# Patient Record
Sex: Male | Born: 1990 | Race: White | Hispanic: No | Marital: Single | Smoking: Current every day smoker
Health system: Southern US, Community
[De-identification: ages and names within clinical notes are randomized; demographics above are authoritative.]

---

## 2016-04-22 ENCOUNTER — Emergency Department
Admission: EM | Admit: 2016-04-22 | Discharge: 2016-04-23 | Disposition: A | Discharge status: Home / Self Care | Payer: Medicaid Other | Attending: Emergency Medicine | Admitting: Emergency Medicine

## 2016-04-22 ENCOUNTER — Encounter: Payer: Self-pay | Admitting: Emergency Medicine

## 2016-04-22 DIAGNOSIS — K029 Dental caries, unspecified: Secondary | ICD-10-CM

## 2016-04-22 DIAGNOSIS — K047 Periapical abscess without sinus: Secondary | ICD-10-CM | POA: Insufficient documentation

## 2016-04-22 DIAGNOSIS — K0889 Other specified disorders of teeth and supporting structures: Secondary | ICD-10-CM | POA: Diagnosis present

## 2016-04-22 MED ORDER — CLINDAMYCIN HCL 300 MG PO CAPS: 300.0000 mg | ORAL_CAPSULE | Freq: Three times a day (TID) | ORAL | 0 refills | Status: AC

## 2016-04-22 MED ORDER — TRAMADOL HCL 50 MG PO TABS: 50.0000 mg | ORAL_TABLET | Freq: Two times a day (BID) | ORAL | 0 refills | Status: DC

## 2016-04-22 MED ORDER — TRAMADOL HCL 50 MG PO TABS
50.0000 mg | ORAL_TABLET | Freq: Once | ORAL | Status: AC
  Administered 2016-04-23: 50 mg via ORAL
  Filled 2016-04-22: qty 1

## 2016-04-22 MED ORDER — CLINDAMYCIN HCL 150 MG PO CAPS
300.0000 mg | ORAL_CAPSULE | Freq: Once | ORAL | Status: AC
  Administered 2016-04-23: 300 mg via ORAL
  Filled 2016-04-22: qty 2

## 2016-04-22 NOTE — Discharge Instructions (Signed)
Take the antibiotic as directed. Take the pain medicine as needed. Rinse with warm-salty water after every meal. Follow-up with one of the dental clinics on the list provided.

## 2016-04-22 NOTE — ED Notes (Addendum)
States back bottom R tooth pain x 3 weeks but states it increased today. Seen a dentist "a while ago." Alert and oriented, no distress noted. States was told "it was an abscess tooth" but didn't have insurance to get it fixed.

## 2016-04-22 NOTE — ED Triage Notes (Signed)
Patient ambulatory to triage with steady gait, without difficulty or distress noted; pt reports right sided dental pain x 2-3 weeks, worse today

## 2016-04-23 NOTE — ED Provider Notes (Signed)
[facility] Emergency Department Provider Note ____________________________________________  Time seen: 2356  I have reviewed the triage vital signs and the nursing notes.  HISTORY  Chief Complaint  Dental Pain  HPI Benjamin [name] is a 25 y.o. male presents to the ED for evaluation of dental pain for the last 2-3 weeks. Patient describes pain to the right lower jaw secondary to swelling of the molar. He does admit to a chronically broken tooth with an intact filling. Denies any interim fevers, chills, sweats. He also denies any spontaneous drainage or difficulty controlling oral secretions. He denies any dental provider for his symptoms.  History reviewed. No pertinent past medical history.  There are no active problems to display for this patient.   History reviewed. No pertinent surgical history.  Prior to Admission medications   Medication Sig Start Date End Date Taking? Authorizing Provider  clindamycin (CLEOCIN) 300 MG capsule Take 1 capsule (300 mg total) by mouth 3 (three) times daily. 04/22/16 05/02/16  Ean Gettel V Bacon Rasheida Broden, PA-C  traMADol (ULTRAM) 50 MG tablet Take 1 tablet (50 mg total) by mouth 2 (two) times daily. 04/22/16   Dory Demont V Bacon Ramsey Guadamuz, PA-C   Allergies Review of patient's allergies indicates no known allergies.  No family history on file.  Social History Social History  Substance Use Topics  . Smoking status: Never Smoker  . Smokeless tobacco: Never Used  . Alcohol use No   Review of Systems  Constitutional: Negative for fever. Eyes: Negative for visual changes. ENT: Negative for sore throat. Right lower dental pain as above. Cardiovascular: Negative for chest pain. Respiratory: Negative for shortness of breath. Neurological: Negative for headaches, focal weakness or numbness. ____________________________________________  PHYSICAL EXAM:  VITAL SIGNS: ED Triage Vitals  Enc Vitals Group     BP 04/22/16 2252 (!)  121/95     Pulse Rate 04/22/16 2252 89     Resp 04/22/16 2252 (!) 22     Temp 04/22/16 2252 97.9 F (36.6 C)     Temp Source 04/22/16 2252 Oral     SpO2 04/22/16 2252 98 %     Weight 04/22/16 2251 181 lb (82.1 kg)     Height 04/22/16 2251 6\' 1"  (1.854 m)     Head Circumference --      Peak Flow --      Pain Score 04/22/16 2251 9     Pain Loc --      Pain Edu? --      Excl. in GC? --    Constitutional: Alert and oriented. Well appearing and in no distress. Head: Normocephalic and atraumatic. Eyes: Conjunctivae are normal. PERRL. Normal extraocular movements Mouth/Throat: Mucous membranes are moist. Uvula is midline and tonsils are flat. No swelling to the floor the mouth is patient. Patient small focal area of buccal edema to the right lower first molar. Neck: Supple. No thyromegaly. Hematological/Lymphatic/Immunological: No cervical lymphadenopathy. Cardiovascular: Normal rate, regular rhythm. Normal distal pulses. Respiratory: Normal respiratory effort. No wheezes/rales/rhonchi. Skin:  Skin is warm, dry and intact. No rash noted. Psychiatric: Mood and affect are normal. Patient exhibits appropriate insight and judgment. ____________________________________________  PROCEDURES  Ultram 50 mg PO Clindamycin 300 mg PO ____________________________________________  INITIAL IMPRESSION / ASSESSMENT AND PLAN / ED COURSE  Patient with acute dental pain secondary to dental caries. She is also with a small dental abscess to lower right jaw. He is discharged with prescription for clindamycin and #10 Ultram for pain. He will follow-up with one of the  local dental clinics on the list provided.  Clinical Course   ____________________________________________  FINAL CLINICAL IMPRESSION(S) / ED DIAGNOSES  Final diagnoses:  Pain due to dental caries  Dental abscess      Lissa HoardJenise V Bacon Millissa Deese, PA-C 04/23/16 0018    Phineas SemenGraydon Goodman, MD 04/23/16 [phone]

## 2020-06-03 ENCOUNTER — Encounter: Payer: Self-pay | Admitting: Emergency Medicine

## 2020-06-03 ENCOUNTER — Other Ambulatory Visit: Payer: Self-pay

## 2020-06-03 ENCOUNTER — Ambulatory Visit
Admission: EM | Admit: 2020-06-03 | Discharge: 2020-06-03 | Disposition: A | Discharge status: Home / Self Care | Payer: Medicaid Other | Attending: Emergency Medicine | Admitting: Emergency Medicine

## 2020-06-03 DIAGNOSIS — M545 Low back pain, unspecified: Secondary | ICD-10-CM | POA: Diagnosis not present

## 2020-06-03 MED ORDER — TIZANIDINE HCL 4 MG PO TABS: 4.0000 mg | ORAL_TABLET | Freq: Four times a day (QID) | ORAL | 0 refills | Status: DC | PRN

## 2020-06-03 MED ORDER — IBUPROFEN 600 MG PO TABS: 600.0000 mg | ORAL_TABLET | Freq: Four times a day (QID) | ORAL | 0 refills | Status: DC | PRN

## 2020-06-03 NOTE — Discharge Instructions (Addendum)
Take the ibuprofen every 6 hours with food for the next 2 days on a schedule and then as needed for pain.  Take the tizanidine every 6 hours on a schedule for the next 2 days and then as needed spasm.  I have referred you to [facility] for evaluation of the lipoma on your low back.  Please discuss treatment options, including removal, with them.

## 2020-06-03 NOTE — ED Provider Notes (Signed)
[facility]    CSN: [id number] Arrival date & time: 06/03/20  1213      History   Chief Complaint Chief Complaint  Patient presents with   Cyst   Back Pain    HPI Benjamin [name] is a 29 y.o. male.   HPI   29 year old male here for evaluation of low back pain that started yesterday.  Patient reports that he has had a knot in his low back that is been there for years but is now causing pain.  Patient states that he coughed yesterday and the felt the pain has been sharp ever since.  Patient denies any radiation into either leg.  No numbness tingling or weakness in either leg.  History reviewed. No pertinent past medical history.  There are no problems to display for this patient.   History reviewed. No pertinent surgical history.     Home Medications    Prior to Admission medications   Medication Sig Start Date End Date Taking? Authorizing Provider  ibuprofen (ADVIL) 600 MG tablet Take 1 tablet (600 mg total) by mouth every 6 (six) hours as needed. 06/03/20   Becky Augusta, NP  tiZANidine (ZANAFLEX) 4 MG tablet Take 1 tablet (4 mg total) by mouth every 6 (six) hours as needed for muscle spasms. 06/03/20   Becky Augusta, NP    Family History History reviewed. No pertinent family history.  Social History Social History   Tobacco Use   Smoking status: Current Every Day Smoker    Types: Cigarettes   Smokeless tobacco: Never Used  Substance Use Topics   Alcohol use: Yes   Drug use: Never     Allergies   Penicillins   Review of Systems Review of Systems  Constitutional: Negative for activity change and appetite change.  Musculoskeletal: Positive for back pain.  Skin: Negative for rash.  Neurological: Negative for weakness and numbness.  Hematological: Negative.   Psychiatric/Behavioral: Negative.      Physical Exam Triage Vital Signs ED Triage Vitals  Enc Vitals Group     BP 06/03/20 1254 (!) 143/100     Pulse Rate 06/03/20 1254  85     Resp 06/03/20 1254 18     Temp 06/03/20 1254 98.2 F (36.8 C)     Temp Source 06/03/20 1254 Oral     SpO2 06/03/20 1254 100 %     Weight 06/03/20 1252 180 lb (81.6 kg)     Height 06/03/20 1252 6\' 1"  (1.854 m)     Head Circumference --      Peak Flow --      Pain Score 06/03/20 1252 10     Pain Loc --      Pain Edu? --      Excl. in GC? --    No data found.  Updated Vital Signs BP (!) 143/100 (BP Location: Left Arm)    Pulse 85    Temp 98.2 F (36.8 C) (Oral)    Resp 18    Ht 6\' 1"  (1.854 m)    Wt 180 lb (81.6 kg)    SpO2 100%    BMI 23.75 kg/m   Visual Acuity Right Eye Distance:   Left Eye Distance:   Bilateral Distance:    Right Eye Near:   Left Eye Near:    Bilateral Near:     Physical Exam Vitals and nursing note reviewed.  Constitutional:      General: He is not in acute distress.    Appearance:  Normal appearance. He is normal weight.  HENT:     Head: Normocephalic and atraumatic.  Musculoskeletal:        General: Swelling and tenderness present. No deformity or signs of injury.  Skin:    General: Skin is warm and dry.     Findings: No erythema or rash.  Neurological:     General: No focal deficit present.     Mental Status: He is alert.     Sensory: No sensory deficit.     Motor: No weakness.     Coordination: Coordination normal.     Gait: Gait normal.  Psychiatric:        Mood and Affect: Mood normal.        Behavior: Behavior normal.        Thought Content: Thought content normal.        Judgment: Judgment normal.      UC Treatments / Results  Labs (all labs ordered are listed, but only abnormal results are displayed) Labs Reviewed - No data to display  EKG   Radiology No results found.  Procedures Procedures (including critical care time)  Medications Ordered in UC Medications - No data to display  Initial Impression / Assessment and Plan / UC Course  I have reviewed the triage vital signs and the nursing notes.  Pertinent  labs & imaging results that were available during my care of the patient were reviewed by me and considered in my medical decision making (see chart for details).   Patient is here for evaluation of low back pain that started yesterday.  Patient has a lipoma overlying his sacral spine.  The lipoma is just right of midline.  Is not erythematous, edematous, indurated, or fluctuant.  The lipoma is freely mobile.  Patient has no appreciable paraspinous tenderness or spasm.  Patient has no weakness in his lower extremities.  Unsure if lipoma is the cause of pain.  Patient denies any trauma.  Patient is ambulatory without difficulty.   Final Clinical Impressions(s) / UC Diagnoses   Final diagnoses:  Acute low back pain without sciatica, unspecified back pain laterality     Discharge Instructions     Take the ibuprofen every 6 hours with food for the next 2 days on a schedule and then as needed for pain.  Take the tizanidine every 6 hours on a schedule for the next 2 days and then as needed spasm.  I have referred you to [facility] for evaluation of the lipoma on your low back.  Please discuss treatment options, including removal, with them.    ED Prescriptions    Medication Sig Dispense Auth. Provider   ibuprofen (ADVIL) 600 MG tablet Take 1 tablet (600 mg total) by mouth every 6 (six) hours as needed. 30 tablet Becky Augusta, NP   tiZANidine (ZANAFLEX) 4 MG tablet Take 1 tablet (4 mg total) by mouth every 6 (six) hours as needed for muscle spasms. 30 tablet Becky Augusta, NP     I have reviewed the PDMP during this encounter.   Becky Augusta, NP 06/03/20 1351

## 2020-06-03 NOTE — ED Triage Notes (Signed)
Patient c/o lower back that started yesterday. He reports a cyst in that area that he has had for years but now is causing pain.

## 2020-12-03 ENCOUNTER — Ambulatory Visit: Payer: Medicaid Other | Admitting: Dermatology

## 2021-02-09 ENCOUNTER — Encounter: Payer: Self-pay | Admitting: Emergency Medicine

## 2021-02-09 ENCOUNTER — Other Ambulatory Visit: Payer: Self-pay

## 2021-02-09 ENCOUNTER — Ambulatory Visit (INDEPENDENT_AMBULATORY_CARE_PROVIDER_SITE_OTHER): Payer: Medicaid Other

## 2021-02-09 ENCOUNTER — Ambulatory Visit
Admission: EM | Admit: 2021-02-09 | Discharge: 2021-02-09 | Disposition: A | Discharge status: Home / Self Care | Payer: Medicaid Other | Attending: Emergency Medicine | Admitting: Emergency Medicine

## 2021-02-09 DIAGNOSIS — K208 Other esophagitis without bleeding: Secondary | ICD-10-CM | POA: Diagnosis not present

## 2021-02-09 DIAGNOSIS — T50905A Adverse effect of unspecified drugs, medicaments and biological substances, initial encounter: Secondary | ICD-10-CM

## 2021-02-09 DIAGNOSIS — R079 Chest pain, unspecified: Secondary | ICD-10-CM

## 2021-02-09 LAB — TROPONIN I (HIGH SENSITIVITY): Troponin I (High Sensitivity): 4 ng/L (ref <18)

## 2021-02-09 MED ORDER — FAMOTIDINE 20 MG PO TABS: 20.0000 mg | ORAL_TABLET | Freq: Two times a day (BID) | ORAL | 0 refills | Status: AC

## 2021-02-09 MED ORDER — LIDOCAINE VISCOUS HCL 2 % MT SOLN
15.0000 mL | Freq: Once | OROMUCOSAL | Status: AC
  Administered 2021-02-09: 15 mL via ORAL

## 2021-02-09 MED ORDER — ALUM & MAG HYDROXIDE-SIMETH 200-200-20 MG/5ML PO SUSP
30.0000 mL | Freq: Once | ORAL | Status: AC
  Administered 2021-02-09: 30 mL via ORAL

## 2021-02-09 MED ORDER — IBUPROFEN 600 MG PO TABS: 600.0000 mg | ORAL_TABLET | Freq: Four times a day (QID) | ORAL | 0 refills | Status: AC | PRN

## 2021-02-09 NOTE — Discharge Instructions (Addendum)
I suspect that you have an esophagitis from taking all of the niacin plus minus musculoskeletal chest wall pain.  You can try Pepcid which may help with the esophagitis.  1000 milligrams Tylenol/600 mg ibuprofen combined 3-4 times a day should help with musculoskeletal chest wall pain.  Follow-up with primary care provider of your choice ASAP.  See list below.  Here is a list of primary care providers who are taking new patients:  Dr. Elizabeth Sauer [address] [place] [address] [phone]  [facility] [address] [place], [place] [address] [phone]  [facility] [place] [address] [place] [address]  [phone]  [facility] [address] [place], [place] [address] [phone]  [facility] [address] [place]  [phone] [place], [place] [address]  Here are clinics/ other resources who will see you if you do not have insurance. Some have certain criteria that you must meet. Call them and find out what they are:  [facility]: [address], [place], [place] [address] Phone: [phone] Hours: First and Third Saturdays of each Month, 9 a.m. - 1 p.m.  [facility]: [address], [address] [place], [place] [address] Phone: [phone] Hours: Tuesday, 4 p.m. - 8 p.m. Thursday, 1 p.m. - 8 p.m. Wednesday, 9 a.m. - [facility] [address] [place], [place] [address] Phone: [phone] Pharmacy Phone Number: [phone] Dental Phone Number: [phone] [facility] Insurance Help: [phone]  Dental Hours: Monday - Thursday, 8 a.m. - 6 p.m.  [facility] [address] [place], [place] [address] Phone: [phone] Pharmacy Phone Number: [phone] [facility] Insurance Help: [phone]  [facility] [address] [place] [place], [place] [address] Phone: [phone] Pharmacy Phone Number: [phone] [facility] Insurance Help:  [phone]  [facility] [address] [place], [place] [address] Phone: [phone] [facility] Insurance Help: [phone]   [facility] [address], [place], [place] [address] Phone: [phone]  Go to www.goodrx.com  or www.costplusdrugs.com to look up your medications. This will give you a list of where you can find your prescriptions at the most affordable prices. Or ask the pharmacist what the cash price is, or if they have any other discount programs available to help make your medication more affordable. This can be less expensive than what you would pay with insurance.

## 2021-02-09 NOTE — ED Triage Notes (Signed)
Patient complains of left sided chest pain that varies x 3-4 days. States that he feels like he has palpitations with it as well.

## 2021-02-09 NOTE — ED Provider Notes (Signed)
HPI  SUBJECTIVE:  Benjamin [name] is a 30 y.o. male who presents with 4 days of constant, sharp, left-sided chest pain and intermittent palpitations starting the day after taking 14 niacin in an effort to get [facility] out of his system.  He states it radiates to his neck with coughing only.  It does not otherwise radiate up his neck, down his arm, through to his back.  No nausea, vomiting, diaphoresis, chest pressure, heaviness.  There is no exertional component.  No belching, waterbrash, coughing, wheezing, shortness of breath, recent trauma to the chest.  He does lots of heavy lifting at work, but has not otherwise changes to physical activity recently.  It is not associated with arm movement, torso rotation.  No lightheadedness, dizziness, abdominal pain.  No calf pain, swelling, recent immobilization, surgery in the past 4 weeks, hemoptysis, exogenous estrogen.  He has not tried anything for this.  No alleviating factors.  Symptoms are worse with coughing, movement, lying down, smoking and with getting upset.  No recent viral illness.  he smokes marijuana and tobacco.  He denies other illicit drug use.  No history of diabetes, hypertension, obesity, MI, CVA/TIA, PAD/PVD, hypercholesterolemia, coronary artery disease, DVT, PE, cancer, hypercoagulability.  Family history significant for father with an MI at age 5, grandfather in his 28s.  PMD: None.   History reviewed. No pertinent past medical history.  History reviewed. No pertinent surgical history.  History reviewed. No pertinent family history.  Social History   Tobacco Use   Smoking status: Every Day    Types: Cigarettes   Smokeless tobacco: Never  Substance Use Topics   Alcohol use: Yes   Drug use: Never    No current facility-administered medications for this encounter.  Current Outpatient Medications:    famotidine (PEPCID) 20 MG tablet, Take 1 tablet (20 mg total) by mouth 2 (two) times daily., Disp: 40 tablet, Rfl: 0   ibuprofen  (ADVIL) 600 MG tablet, Take 1 tablet (600 mg total) by mouth every 6 (six) hours as needed., Disp: 30 tablet, Rfl: 0  Allergies  Allergen Reactions   Penicillins      ROS  As noted in HPI.   Physical Exam  BP (!) 142/79 (BP Location: Right Arm)   Pulse 82   Temp 99.1 F (37.3 C) (Oral)   Resp 15   Ht 6\' 1"  (1.854 m)   Wt 86.2 kg   SpO2 100%   BMI 25.07 kg/m   Constitutional: Well developed, well nourished, appears anxious Eyes:  EOMI, conjunctiva normal bilaterally HENT: Normocephalic, atraumatic,mucus membranes moist Respiratory: Normal inspiratory effort, lungs clear bilaterally.  Positive left-sided anterior chest wall tenderness. Cardiovascular: Normal rate, regular rhythm, no murmurs, rubs, gallops GI: nondistended skin: No rash, skin intact Musculoskeletal: Calves symmetric, nontender, no edema Neurologic: Alert & oriented x 3, no focal neuro deficits Psychiatric: Speech and behavior appropriate   ED Course   Medications  alum & mag hydroxide-simeth (MAALOX/MYLANTA) 200-200-20 MG/5ML suspension 30 mL (30 mLs Oral Given 02/09/21 1602)    And  lidocaine (XYLOCAINE) 2 % viscous mouth solution 15 mL (15 mLs Oral Given 02/09/21 1602)    Orders Placed This Encounter  Procedures   DG Chest 2 View    Standing Status:   Standing    Number of Occurrences:   1    Order Specific Question:   Reason for Exam (SYMPTOM  OR DIAGNOSIS REQUIRED)    Answer:   left sided CP   EKG 12-Lead  Standing Status:   Standing    Number of Occurrences:   1    Results for orders placed or performed during the hospital encounter of 02/09/21 (from the past 24 hour(s))  Troponin I (High Sensitivity)     Status: None   Collection Time: 02/09/21  4:08 PM  Result Value Ref Range   Troponin I (High Sensitivity) 4 <18 ng/L   DG Chest 2 View  Result Date: 02/09/2021 CLINICAL DATA:  Left-sided chest pain EXAM: CHEST - 2 VIEW COMPARISON:  None. FINDINGS: The heart size and mediastinal  contours are within normal limits. Both lungs are clear. The visualized skeletal structures are unremarkable. IMPRESSION: No active cardiopulmonary disease. Electronically Signed   By: Signa Kell M.D.   On: 02/09/2021 16:45    ED Clinical Impression  1. Nonspecific chest pain   2. Pill esophagitis      ED Assessment/Plan  EKG: Normal sinus rhythm, rate 78.  Normal axis, normal intervals.  No hypertrophy.  No ST-T wave changes.  No previous EKG for comparison.  Patient symptomatic while EKG was obtained. Patient is PERC negative.  Doubt PE.  Suspect pill esophagitis or musculoskeletal chest pain because it is reproducible.  He has a normal EKG.  will check a troponin due to risk factors of smoking and significant family history. Chest x-ray to rule out esophageal perforation.  GI cocktail.  Will reevaluate.   Reviewed imaging independently.  Normal chest x-ray. see radiology report for full details.  Reevaluation, patient states that he feels better after GI cocktail.  Troponin negative.  EKG, chest x-ray reassuring.  No evidence of pericarditis, myocarditis.  No evidence of ACS. His HEART score is 1-patient at very low risk of MACE over the next 30 days. suspect esophagitis plus minus musculoskeletal chest pain.  Will send home with Tylenol/ibuprofen and some Pepcid.  Primary care list for ongoing care, will order assistance in finding a PMD.  Strict ER return precautions given.  Discussed labs, imaging, MDM, treatment plan, and plan for follow-up with patient. Discussed sn/sx that should prompt return to the ED. patient agrees with plan.   Meds ordered this encounter  Medications   AND Linked Order Group    alum & mag hydroxide-simeth (MAALOX/MYLANTA) 200-200-20 MG/5ML suspension 30 mL    lidocaine (XYLOCAINE) 2 % viscous mouth solution 15 mL   famotidine (PEPCID) 20 MG tablet    Sig: Take 1 tablet (20 mg total) by mouth 2 (two) times daily.    Dispense:  40 tablet    Refill:  0    ibuprofen (ADVIL) 600 MG tablet    Sig: Take 1 tablet (600 mg total) by mouth every 6 (six) hours as needed.    Dispense:  30 tablet    Refill:  0      *This clinic note was created using Scientist, clinical (histocompatibility and immunogenetics). Therefore, there may be occasional mistakes despite careful proofreading.  ?    Domenick Gong, MD 02/09/21 1736

## 2021-03-23 ENCOUNTER — Emergency Department
Admission: EM | Admit: 2021-03-23 | Discharge: 2021-03-24 | Disposition: A | Discharge status: Other Acute Inpatient Hospital | Payer: Medicaid Other | Attending: Emergency Medicine | Admitting: Emergency Medicine

## 2021-03-23 ENCOUNTER — Other Ambulatory Visit: Payer: Self-pay

## 2021-03-23 ENCOUNTER — Encounter: Payer: Self-pay | Admitting: Emergency Medicine

## 2021-03-23 DIAGNOSIS — T450X2A Poisoning by antiallergic and antiemetic drugs, intentional self-harm, initial encounter: Secondary | ICD-10-CM | POA: Diagnosis present

## 2021-03-23 DIAGNOSIS — F1290 Cannabis use, unspecified, uncomplicated: Secondary | ICD-10-CM | POA: Diagnosis not present

## 2021-03-23 DIAGNOSIS — R45851 Suicidal ideations: Secondary | ICD-10-CM

## 2021-03-23 DIAGNOSIS — T50902A Poisoning by unspecified drugs, medicaments and biological substances, intentional self-harm, initial encounter: Secondary | ICD-10-CM | POA: Diagnosis not present

## 2021-03-23 DIAGNOSIS — F1590 Other stimulant use, unspecified, uncomplicated: Secondary | ICD-10-CM | POA: Insufficient documentation

## 2021-03-23 DIAGNOSIS — F1490 Cocaine use, unspecified, uncomplicated: Secondary | ICD-10-CM | POA: Insufficient documentation

## 2021-03-23 DIAGNOSIS — F1410 Cocaine abuse, uncomplicated: Secondary | ICD-10-CM

## 2021-03-23 DIAGNOSIS — F322 Major depressive disorder, single episode, severe without psychotic features: Secondary | ICD-10-CM | POA: Diagnosis not present

## 2021-03-23 DIAGNOSIS — F1010 Alcohol abuse, uncomplicated: Secondary | ICD-10-CM

## 2021-03-23 DIAGNOSIS — Y906 Blood alcohol level of 120-199 mg/100 ml: Secondary | ICD-10-CM | POA: Insufficient documentation

## 2021-03-23 DIAGNOSIS — F17210 Nicotine dependence, cigarettes, uncomplicated: Secondary | ICD-10-CM | POA: Insufficient documentation

## 2021-03-23 DIAGNOSIS — F332 Major depressive disorder, recurrent severe without psychotic features: Secondary | ICD-10-CM | POA: Diagnosis not present

## 2021-03-23 LAB — COMPREHENSIVE METABOLIC PANEL
ALT: 20 U/L (ref 0–44)
AST: 26 U/L (ref 15–41)
Albumin: 4 g/dL (ref 3.5–5.0)
Alkaline Phosphatase: 65 U/L (ref 38–126)
Anion gap: 10 (ref 5–15)
BUN: 9 mg/dL (ref 6–20)
CO2: 24 mmol/L (ref 22–32)
Calcium: 8.7 mg/dL — ABNORMAL LOW (ref 8.9–10.3)
Chloride: 105 mmol/L (ref 98–111)
Creatinine, Ser: 0.83 mg/dL (ref 0.61–1.24)
GFR, Estimated: >60 mL/min (ref >60)
Glucose, Bld: 86 mg/dL (ref 70–99)
Potassium: 3.6 mmol/L (ref 3.5–5.1)
Sodium: 139 mmol/L (ref 135–145)
Total Bilirubin: 1 mg/dL (ref 0.3–1.2)
Total Protein: 6.8 g/dL (ref 6.5–8.1)

## 2021-03-23 LAB — URINE DRUG SCREEN, QUALITATIVE (ARMC ONLY)
Amphetamines, Ur Screen: POSITIVE — AB
Barbiturates, Ur Screen: NOT DETECTED
Benzodiazepine, Ur Scrn: NOT DETECTED
Cannabinoid 50 Ng, Ur ~~LOC~~: POSITIVE — AB
Cocaine Metabolite,Ur ~~LOC~~: POSITIVE — AB
MDMA (Ecstasy)Ur Screen: NOT DETECTED
Methadone Scn, Ur: NOT DETECTED
Opiate, Ur Screen: NOT DETECTED
Phencyclidine (PCP) Ur S: NOT DETECTED
Tricyclic, Ur Screen: NOT DETECTED

## 2021-03-23 LAB — CBC
HCT: 47.1 % (ref 39.0–52.0)
Hemoglobin: 16.6 g/dL (ref 13.0–17.0)
MCH: 28.8 pg (ref 26.0–34.0)
MCHC: 35.2 g/dL (ref 30.0–36.0)
MCV: 81.6 fL (ref 80.0–100.0)
Platelets: 307 10*3/uL (ref 150–400)
RBC: 5.77 MIL/uL (ref 4.22–5.81)
RDW: 13.5 % (ref 11.5–15.5)
WBC: 6.5 10*3/uL (ref 4.0–10.5)
nRBC: 0 % (ref 0.0–0.2)

## 2021-03-23 LAB — ETHANOL: Alcohol, Ethyl (B): 124 mg/dL — ABNORMAL HIGH (ref <10)

## 2021-03-23 LAB — ACETAMINOPHEN LEVEL
Acetaminophen (Tylenol), Serum: <10 ug/mL — ABNORMAL LOW (ref 10–30)
Acetaminophen (Tylenol), Serum: 15 ug/mL (ref 10–30)

## 2021-03-23 LAB — LIPID PANEL
Cholesterol: 142 mg/dL (ref 0–200)
HDL: 34 mg/dL — ABNORMAL LOW (ref >40)
LDL Cholesterol: 78 mg/dL (ref 0–99)
Total CHOL/HDL Ratio: 4.2 RATIO
Triglycerides: 149 mg/dL (ref <150)
VLDL: 30 mg/dL (ref 0–40)

## 2021-03-23 LAB — SALICYLATE LEVEL: Salicylate Lvl: <7 mg/dL — ABNORMAL LOW (ref 7.0–30.0)

## 2021-03-23 MED ORDER — CHARCOAL ACTIVATED PO LIQD: 50.0000 g | Freq: Once | ORAL | Status: DC

## 2021-03-23 MED ORDER — DIAZEPAM 5 MG/ML IJ SOLN
10.0000 mg | Freq: Once | INTRAMUSCULAR | Status: AC
  Administered 2021-03-23: 10 mg via INTRAMUSCULAR
  Filled 2021-03-23: qty 2

## 2021-03-23 MED ORDER — ZIPRASIDONE MESYLATE 20 MG IM SOLR: 20.0000 mg | Freq: Two times a day (BID) | INTRAMUSCULAR | Status: DC | PRN

## 2021-03-23 NOTE — ED Notes (Signed)
Poison Control called for update and informed us to continue monitoring for 6-8 hrs from time of ingestion, give fluids for any hypotension, and medicate for agitation or seizures.

## 2021-03-23 NOTE — ED Notes (Signed)
Gave food tray with drink. 

## 2021-03-23 NOTE — ED Provider Notes (Signed)
[facility] Emergency Department Provider Note   ____________________________________________   Event Date/Time   First MD Initiated Contact with Patient 03/23/21 0902     (approximate)  I have reviewed the triage vital signs and the nursing notes.   HISTORY  Chief Complaint Drug Overdose   HPI Benjamin [name] is a 30 y.o. male who has been drinking for 4 days and reported feeling overwhelmed.  He took an overdose of more alcohol multiple pills of ZzzQuil and 17 pills of methocarbamol 500 mg each.  He did this in an effort to hurt himself.  He did this at about 8:00 this morning.  He is awake alert and oriented now at 925.  He reports no medical problems.         No past medical history on file.  Patient Active Problem List   Diagnosis Date Noted   Intentional overdose (HCC) 03/23/2021   Severe major depression, single episode, without psychotic features (HCC) 03/23/2021   Alcohol abuse 03/23/2021   Cocaine abuse (HCC) 03/23/2021    No past surgical history on file.  Prior to Admission medications   Medication Sig Start Date End Date Taking? Authorizing Provider  famotidine (PEPCID) 20 MG tablet Take 1 tablet (20 mg total) by mouth 2 (two) times daily. 02/09/21   Domenick Gong, MD  ibuprofen (ADVIL) 600 MG tablet Take 1 tablet (600 mg total) by mouth every 6 (six) hours as needed. 02/09/21   Domenick Gong, MD    Allergies Penicillins  No family history on file.  Social History Social History   Tobacco Use   Smoking status: Every Day    Types: Cigarettes   Smokeless tobacco: Never  Vaping Use   Vaping Use: Every day  Substance Use Topics   Alcohol use: Yes    Comment: Everyday   Drug use: Yes    Types: Marijuana    Comment: everyday    Review of Systems  Constitutional: No fever/chills Eyes: No visual changes. ENT: No sore throat. Cardiovascular: Denies chest pain. Respiratory: Denies shortness of  breath. Gastrointestinal: No abdominal pain.  No nausea, no vomiting.  No diarrhea.  No constipation. Genitourinary: Negative for dysuria. Musculoskeletal: Negative for back pain. Skin: Negative for rash. Neurological: Negative for headaches, focal weakness ]  ____________________________________________   PHYSICAL EXAM:  VITAL SIGNS: ED Triage Vitals  Enc Vitals Group     BP 03/23/21 0900 125/89     Pulse Rate 03/23/21 0900 74     Resp 03/23/21 0900 17     Temp 03/23/21 0908 (!) 97.3 F (36.3 C)     Temp Source 03/23/21 0908 Oral     SpO2 03/23/21 0900 96 %     Weight 03/23/21 0910 180 lb (81.6 kg)     Height 03/23/21 0910 5\' 11"  (1.803 m)     Head Circumference --      Peak Flow --      Pain Score 03/23/21 0910 0     Pain Loc --      Pain Edu? --      Excl. in GC? --    Constitutional: Alert and oriented. Well appearing and in no acute distress. Eyes: Conjunctivae are normal. PER  Head: Atraumatic Nose: No congestion/rhinnorhea. Mouth/Throat: Mucous membranes are moist.  Oropharynx non-erythematous. Neck: No stridor.  Cardiovascular: Normal rate, regular rhythm. Grossly normal heart sounds.  Good peripheral circulation. Respiratory: Normal respiratory effort.  No retractions. Lungs CTAB. Gastrointestinal: Soft and nontender. No distention.  No abdominal bruits.  Musculoskeletal: No lower extremity tenderness nor edema.   Neurologic:  Normal speech and language. No gross focal neurologic deficits are appreciated.  Skin:  Skin is warm, dry and intact. No rash noted.   ____________________________________________   LABS (all labs ordered are listed, but only abnormal results are displayed)  Labs Reviewed  COMPREHENSIVE METABOLIC PANEL - Abnormal; Notable for the following components:      Result Value   Calcium 8.7 (*)    All other components within normal limits  ETHANOL - Abnormal; Notable for the following components:   Alcohol, Ethyl (B) 124 (*)    All other  components within normal limits  URINE DRUG SCREEN, QUALITATIVE ([facility] ONLY) - Abnormal; Notable for the following components:   Amphetamines, Ur Screen POSITIVE (*)    Cocaine Metabolite,Ur [place] POSITIVE (*)    Cannabinoid 50 Ng, Ur [place] POSITIVE (*)    All other components within normal limits  ACETAMINOPHEN LEVEL - Abnormal; Notable for the following components:   Acetaminophen (Tylenol), Serum <10 (*)    All other components within normal limits  SALICYLATE LEVEL - Abnormal; Notable for the following components:   Salicylate Lvl <7.0 (*)    All other components within normal limits  CBC  ACETAMINOPHEN LEVEL  HEMOGLOBIN A1C  LIPID PANEL   ____________________________________________  EKG  __EKG read interpreted by me shows normal sinus rhythm rate of 75 normal axis normal EKG QRS durations 103 ms.  We will repeat this in half an hour.  ____EKG #2 read interpreted by me shows normal sinus rhythm rate of 97 normal axis QRS is now 97 ms.  ______________________________________  RADIOLOGY Jill Poling, personally viewed and evaluated these images (plain radiographs) as part of my medical decision making, as well as reviewing the written report by the radiologist.  ED MD interpretation:    Official radiology report(s): No results found.  ____________________________________________   PROCEDURES  Procedure(s) performed (including Critical Care):  Procedures   ____________________________________________   INITIAL IMPRESSION / ASSESSMENT AND PLAN / ED COURSE ----------------------------------------- 9:33 AM on 03/23/2021 ----------------------------------------- We will have to get a second Tylenol level at noon.  I have consulted psych and TTS is also have a consult in.   ----------------------------------------- 10:12 AM on 03/23/2021 -----------------------------------------  Patient is getting somewhat sleepy.  I have asked the nurse not to give him his  charcoal.  I do not want him to aspirate.  EKG shows QRS is decreased in length.  Notes good.   ----------------------------------------- 2:52 PM on 03/23/2021 ----------------------------------------- Dr. Toni Amend was seeing the patient and told him he was going to keep him overnight.  Patient got agitated and began yelling and had to be sedated by Dr. Toni Amend.  We will get him ready to go downstairs.      ____________________________________________   FINAL CLINICAL IMPRESSION(S) / ED DIAGNOSES  Final diagnoses:  Intentional overdose, initial encounter [facility])  Suicidal ideation     ED Discharge Orders     None        Note:  This document was prepared using Dragon voice recognition software and may include unintentional dictation errors.    Arnaldo Natal, MD 03/23/21 1452

## 2021-03-23 NOTE — ED Notes (Signed)
Patient visitor at bedside.

## 2021-03-23 NOTE — Consult Note (Signed)
[facility] Face-to-Face Psychiatry Consult   Reason for Consult:  intentional overdose Referring Physician:  Darnelle Catalan Patient Identification: Benjamin [name] MRN:  [id number] Principal Diagnosis: Intentional overdose [facility]) Diagnosis:  Principal Problem:   Intentional overdose (HCC)   Total Time spent with patient: 1 hour  Subjective:  "I want my family back" Benjamin [name] is a 30 y.o. male patient admitted with intentional overdose.  HPI:  Patient seen and chart reviewed. Patient is sleepy with eyes closed most of interview.  Patient states he has been with his girlfriend for 9 years. They have 3 kids, ages 1, 17, and 9 years. They have been arguing a lot and she left with the kids on 03/15/21. Since that time he has not gone to work (works at [employer] in [place]) and has been drinking more. He says he he took some "muscle relaxants that he found on the ground" with the alcohol. Admits to "some upper" use, "but not much." UDS positive for amphetamines, cocaine, cannabinoids. Patient took these drugs with alcohol and then called his girlfriend, who came over. Patient states "I did it as a cry for help or mabe to get her to come back." Maybe it was "kind of a suicide attempt." He denies current suicidal ideation and states that he is happy to be alive. Patient states he drinks 5-6 regular size beers daily. Patient says he was hospitalized once as a kid for "anger issues and ADHD." He took Strattera and Vyvanse then. He is from [place] and moved here in 2015. He denies previous suicide attempts. Has not had any psychiatric care since being hospitalized as a kids.  Patient admits to depression and states "I need to stop drinking so much."  Patient denies homicidal ideations. Endorses depression, poor sleep and poor appetite. Does not appear to be responding to internal stimuli.   Past Psychiatric History: Hospitalized in youth with diagnoses of ADHD and "anger issues." Denies previous suicide  attempts  Risk to Self:   Risk to Others:   Prior Inpatient Therapy:   Prior Outpatient Therapy:    Past Medical History: No past medical history on file. No past surgical history on file. Family History: No family history on file. Family Psychiatric  History: Denies Social History:  Social History   Substance and Sexual Activity  Alcohol Use Yes   Comment: Everyday     Social History   Substance and Sexual Activity  Drug Use Yes   Types: Marijuana   Comment: everyday    Social History   Socioeconomic History   Marital status: Single    Spouse name: Not on file   Number of children: Not on file   Years of education: Not on file   Highest education level: Not on file  Occupational History   Not on file  Tobacco Use   Smoking status: Every Day    Types: Cigarettes   Smokeless tobacco: Never  Vaping Use   Vaping Use: Every day  Substance and Sexual Activity   Alcohol use: Yes    Comment: Everyday   Drug use: Yes    Types: Marijuana    Comment: everyday   Sexual activity: Not on file  Other Topics Concern   Not on file  Social History Narrative   Not on file   Social Determinants of Health   Financial Resource Strain: Not on file  Food Insecurity: Not on file  Transportation Needs: Not on file  Physical Activity: Not on file  Stress: Not on  file  Social Connections: Not on file   Additional Social History:    Allergies:   Allergies  Allergen Reactions   Penicillins     Labs:  Results for orders placed or performed during the hospital encounter of 03/23/21 (from the past 48 hour(s))  Comprehensive metabolic panel     Status: Abnormal   Collection Time: 03/23/21  9:08 AM  Result Value Ref Range   Sodium 139 135 - 145 mmol/L   Potassium 3.6 3.5 - 5.1 mmol/L   Chloride 105 98 - 111 mmol/L   CO2 24 22 - 32 mmol/L   Glucose, Bld 86 70 - 99 mg/dL    Comment: Glucose reference range applies only to samples taken after fasting for at least 8 hours.    BUN 9 6 - 20 mg/dL   Creatinine, Ser 6.21 0.61 - 1.24 mg/dL   Calcium 8.7 (L) 8.9 - 10.3 mg/dL   Total Protein 6.8 6.5 - 8.1 g/dL   Albumin 4.0 3.5 - 5.0 g/dL   AST 26 15 - 41 U/L   ALT 20 0 - 44 U/L   Alkaline Phosphatase 65 38 - 126 U/L   Total Bilirubin 1.0 0.3 - 1.2 mg/dL   GFR, Estimated >30 >86 mL/min    Comment: (NOTE) Calculated using the CKD-EPI Creatinine Equation (2021)    Anion gap 10 5 - 15    Comment: Performed at [facility], [address] [place], [place] [address]  Ethanol     Status: Abnormal   Collection Time: 03/23/21  9:08 AM  Result Value Ref Range   Alcohol, Ethyl (B) 124 (H) <10 mg/dL    Comment: (NOTE) Lowest detectable limit for serum alcohol is 10 mg/dL.  For medical purposes only. Performed at [facility], [address] [place], [place] [address]   cbc     Status: None   Collection Time: 03/23/21  9:08 AM  Result Value Ref Range   WBC 6.5 4.0 - 10.5 K/uL   RBC 5.77 4.22 - 5.81 MIL/uL   Hemoglobin 16.6 13.0 - 17.0 g/dL   HCT 28.4 13.2 - 44.0 %   MCV 81.6 80.0 - 100.0 fL   MCH 28.8 26.0 - 34.0 pg   MCHC 35.2 30.0 - 36.0 g/dL   RDW 10.2 72.5 - 36.6 %   Platelets 307 150 - 400 K/uL   nRBC 0.0 0.0 - 0.2 %    Comment: Performed at [facility], [address] [place], [place] [address]  Acetaminophen level     Status: Abnormal   Collection Time: 03/23/21  9:08 AM  Result Value Ref Range   Acetaminophen (Tylenol), Serum <10 (L) 10 - 30 ug/mL    Comment: (NOTE) Therapeutic concentrations vary significantly. A range of 10-30 ug/mL  may be an effective concentration for many patients. However, some  are best treated at concentrations outside of this range. Acetaminophen concentrations >150 ug/mL at 4 hours after ingestion  and >50 ug/mL at 12 hours after ingestion are often associated with  toxic reactions.  Performed at [facility], [address] [place], [place] [address]   Salicylate level      Status: Abnormal   Collection Time: 03/23/21  9:08 AM  Result Value Ref Range   Salicylate Lvl <7.0 (L) 7.0 - 30.0 mg/dL    Comment: Performed at [facility], [address] [place], [place] [address]  Urine Drug Screen, Qualitative     Status: Abnormal  Collection Time: 03/23/21 10:04 AM  Result Value Ref Range   Tricyclic, Ur Screen NONE DETECTED NONE DETECTED   Amphetamines, Ur Screen POSITIVE (A) NONE DETECTED   MDMA (Ecstasy)Ur Screen NONE DETECTED NONE DETECTED   Cocaine Metabolite,Ur [place] POSITIVE (A) NONE DETECTED   Opiate, Ur Screen NONE DETECTED NONE DETECTED   Phencyclidine (PCP) Ur S NONE DETECTED NONE DETECTED   Cannabinoid 50 Ng, Ur  POSITIVE (A) NONE DETECTED   Barbiturates, Ur Screen NONE DETECTED NONE DETECTED   Benzodiazepine, Ur Scrn NONE DETECTED NONE DETECTED   Methadone Scn, Ur NONE DETECTED NONE DETECTED    Comment: (NOTE) Tricyclics + metabolites, urine    Cutoff 1000 ng/mL Amphetamines + metabolites, urine  Cutoff 1000 ng/mL MDMA (Ecstasy), urine              Cutoff 500 ng/mL Cocaine Metabolite, urine          Cutoff 300 ng/mL Opiate + metabolites, urine        Cutoff 300 ng/mL Phencyclidine (PCP), urine         Cutoff 25 ng/mL Cannabinoid, urine                 Cutoff 50 ng/mL Barbiturates + metabolites, urine  Cutoff 200 ng/mL Benzodiazepine, urine              Cutoff 200 ng/mL Methadone, urine                   Cutoff 300 ng/mL  The urine drug screen provides only a preliminary, unconfirmed analytical test result and should not be used for non-medical purposes. Clinical consideration and professional judgment should be applied to any positive drug screen result due to possible interfering substances. A more specific alternate chemical method must be used in order to obtain a confirmed analytical result. Gas chromatography / mass spectrometry (GC/MS) is the preferred confirm atory method. Performed at [facility], [address], [place], [place] [address]     Current Facility-Administered Medications  Medication Dose Route Frequency Provider Last Rate Last Admin   charcoal activated (NO SORBITOL) (ACTIDOSE-AQUA) suspension 50 g  50 g Oral Once Arnaldo Natal, MD       ziprasidone (GEODON) injection 20 mg  20 mg Intramuscular Q12H PRN Clapacs, Jackquline Denmark, MD       Current Outpatient Medications  Medication Sig Dispense Refill   famotidine (PEPCID) 20 MG tablet Take 1 tablet (20 mg total) by mouth 2 (two) times daily. 40 tablet 0   ibuprofen (ADVIL) 600 MG tablet Take 1 tablet (600 mg total) by mouth every 6 (six) hours as needed. 30 tablet 0    Musculoskeletal: Strength & Muscle Tone: decreased, patient sleepy Gait & Station:  did not observe Patient leans: N/A     Psychiatric Specialty Exam:  Presentation  General Appearance:  Disheveled Eye Contact: Absent Speech: Slow Speech Volume: Decreased Handedness: No data recorded  Mood and Affect  Mood: Depressed; Anxious Affect: Blunt  Thought Process  Thought Processes: Coherent Descriptions of Associations:Intact Orientation:Full (Time, Place and Person) Thought Content:WDL History of Schizophrenia/Schizoaffective disorder:No data recorded Duration of Psychotic Symptoms:No data recorded Hallucinations:Hallucinations: None Ideas of Reference:None Suicidal Thoughts:Suicidal Thoughts: No (Denies currently) Homicidal Thoughts:Homicidal Thoughts: No  Sensorium  Memory: Immediate Fair Judgment: Impaired Insight: Poor  Executive Functions  Concentration: Fair Attention Span: Fair Recall: Fair Fund of Knowledge: Fair Language: Fair  Psychomotor Activity  Psychomotor Activity: Psychomotor Activity: Normal  Assets  Assets: Resilience; Physical  Health; Housing  Sleep  Sleep: Sleep: Poor  Physical Exam: Physical Exam Vitals and nursing note reviewed.  HENT:     Head: Normocephalic.     Nose: No congestion  or rhinorrhea.  Eyes:     General:        Right eye: No discharge.        Left eye: No discharge.  Pulmonary:     Effort: Pulmonary effort is normal.  Musculoskeletal:        General: Normal range of motion.     Cervical back: Normal range of motion.  Skin:    General: Skin is dry.  Neurological:     Mental Status: He is oriented to person, place, and time.  Psychiatric:        Attention and Perception: He is inattentive.        Mood and Affect: Mood is depressed. Affect is blunt.        Speech: Speech is delayed (very sleepy).        Behavior: Behavior is slowed. Behavior is cooperative.        Thought Content: Thought content normal.        Cognition and Memory: Cognition normal.        Judgment: Judgment is impulsive.   ROS Blood pressure 128/88, pulse 74, temperature (!) 97.3 F (36.3 C), temperature source Oral, resp. rate 18, height 5\' 11"  (1.803 m), weight 81.6 kg, SpO2 99 %. Body mass index is 25.1 kg/m.  Treatment Plan Summary: Daily contact with patient to assess and evaluate symptoms and progress in treatment and Medication management. 30 year old male with intentional overdose.Patient will need to be observed and on cardiac monitor for 6 hours.    Disposition: Recommend psychiatric Inpatient admission when medically cleared.37, NP 03/23/2021 11:57 AM

## 2021-03-23 NOTE — ED Notes (Addendum)
Secure msg sent to Dr. Toni Amend, letting him know that pt is awake again.

## 2021-03-23 NOTE — BH Assessment (Signed)
Patient has been accepted to [facility]  Patient assigned to [facility] Unit Accepting physician is Dr. Elby Showers.  Call report to [phone].  Representative was [employer].   ER Staff is aware of it:  Tamra, ER Secretary  Dr. Katrinka Blazing, ER MD  Marcelino Duster Patient's Nurse     Patient can be transported anytime after 8 am on 03/24/21.

## 2021-03-23 NOTE — Consult Note (Signed)
[facility] Face-to-Face Psychiatry Consult   Reason for Consult: Consult for 30 year old [name] who came into the emergency room after taking an intentional overdose of muscle relaxer Referring Physician: Darnelle Catalan Patient Identification: Benjamin [name] MRN:  [id number] Principal Diagnosis: Severe major depression, single episode, without psychotic features (HCC) Diagnosis:  Principal Problem:   Severe major depression, single episode, without psychotic features (HCC) Active Problems:   Intentional overdose (HCC)   Alcohol abuse   Cocaine abuse (HCC)   Total Time spent with patient: 1 hour  Subjective:   Benjamin [name] is a 30 y.o. [name] patient admitted with "I just could not sleep".  HPI: Patient seen chart reviewed.  Benjamin [name] came to the emergency room after 911 was called because he took an overdose of Robaxin.  Apparently called his estranged wife having taken the pills and was slurring his speech and she probably called 911.  Patient says that he has been under a great deal of stress for about a week.  His wife left him about a week ago and took all of their children.  Patient admits that this was probably because his drinking had gotten out of control.  Since then he has not gone to work but is sad around at home drinking all day.  Also has been using cocaine and marijuana and amphetamines.  Has not slept in several days.  Not eating normally.  Mood bad.  Patient claims that he took the overdose only because he could not sleep.  Denies acute suicidal ideation.  Admits that mood has been really bad recently.  Not getting any outpatient mental health or psychiatric treatment.  Past Psychiatric History: Patient admits to a history of alcohol abuse going back a few years but has never been in enough treatment to know if he gets withdrawal or DTs.  Tried going to a residential program once but only lasted about a day.  No other experience that we can identify of substance abuse  treatment.  Denies past suicide attempts.  Denies past treatment of depression or other mental health problems.  Risk to Self:   Risk to Others:   Prior Inpatient Therapy:   Prior Outpatient Therapy:    Past Medical History: No past medical history on file. No past surgical history on file. Family History: No family history on file. Family Psychiatric  History: Multiple people in his family may have substance abuse problems but he is not sure because he is adopted. Social History:  Social History   Substance and Sexual Activity  Alcohol Use Yes   Comment: Everyday     Social History   Substance and Sexual Activity  Drug Use Yes   Types: Marijuana   Comment: everyday    Social History   Socioeconomic History   Marital status: Single    Spouse name: Not on file   Number of children: Not on file   Years of education: Not on file   Highest education level: Not on file  Occupational History   Not on file  Tobacco Use   Smoking status: Every Day    Types: Cigarettes   Smokeless tobacco: Never  Vaping Use   Vaping Use: Every day  Substance and Sexual Activity   Alcohol use: Yes    Comment: Everyday   Drug use: Yes    Types: Marijuana    Comment: everyday   Sexual activity: Not on file  Other Topics Concern   Not on file  Social History Narrative  Not on file   Social Determinants of Health   Financial Resource Strain: Not on file  Food Insecurity: Not on file  Transportation Needs: Not on file  Physical Activity: Not on file  Stress: Not on file  Social Connections: Not on file   Additional Social History:    Allergies:   Allergies  Allergen Reactions   Penicillins     Labs:  Results for orders placed or performed during the hospital encounter of 03/23/21 (from the past 48 hour(s))  Comprehensive metabolic panel     Status: Abnormal   Collection Time: 03/23/21  9:08 AM  Result Value Ref Range   Sodium 139 135 - 145 mmol/L   Potassium 3.6 3.5 - 5.1  mmol/L   Chloride 105 98 - 111 mmol/L   CO2 24 22 - 32 mmol/L   Glucose, Bld 86 70 - 99 mg/dL    Comment: Glucose reference range applies only to samples taken after fasting for at least 8 hours.   BUN 9 6 - 20 mg/dL   Creatinine, Ser 4.16 0.61 - 1.24 mg/dL   Calcium 8.7 (L) 8.9 - 10.3 mg/dL   Total Protein 6.8 6.5 - 8.1 g/dL   Albumin 4.0 3.5 - 5.0 g/dL   AST 26 15 - 41 U/L   ALT 20 0 - 44 U/L   Alkaline Phosphatase 65 38 - 126 U/L   Total Bilirubin 1.0 0.3 - 1.2 mg/dL   GFR, Estimated >60 >63 mL/min    Comment: (NOTE) Calculated using the CKD-EPI Creatinine Equation (2021)    Anion gap 10 5 - 15    Comment: Performed at [facility], [address] [place], [place] [address]  Ethanol     Status: Abnormal   Collection Time: 03/23/21  9:08 AM  Result Value Ref Range   Alcohol, Ethyl (B) 124 (H) <10 mg/dL    Comment: (NOTE) Lowest detectable limit for serum alcohol is 10 mg/dL.  For medical purposes only. Performed at [facility], [address] [place], [place] [address]   cbc     Status: None   Collection Time: 03/23/21  9:08 AM  Result Value Ref Range   WBC 6.5 4.0 - 10.5 K/uL   RBC 5.77 4.22 - 5.81 MIL/uL   Hemoglobin 16.6 13.0 - 17.0 g/dL   HCT 55.7 32.2 - 02.5 %   MCV 81.6 80.0 - 100.0 fL   MCH 28.8 26.0 - 34.0 pg   MCHC 35.2 30.0 - 36.0 g/dL   RDW 42.7 06.2 - 37.6 %   Platelets 307 150 - 400 K/uL   nRBC 0.0 0.0 - 0.2 %    Comment: Performed at [facility], [address] [place], [place] [address]  Acetaminophen level     Status: Abnormal   Collection Time: 03/23/21  9:08 AM  Result Value Ref Range   Acetaminophen (Tylenol), Serum <10 (L) 10 - 30 ug/mL    Comment: (NOTE) Therapeutic concentrations vary significantly. A range of 10-30 ug/mL  may be an effective concentration for many patients. However, some  are best treated at concentrations outside of this range. Acetaminophen concentrations >150 ug/mL at 4 hours after  ingestion  and >50 ug/mL at 12 hours after ingestion are often associated with  toxic reactions.  Performed at [facility], [address], [place], [place] [address]   Salicylate level     Status: Abnormal   Collection Time: 03/23/21  9:08 AM  Result Value Ref  Range   Salicylate Lvl <7.0 (L) 7.0 - 30.0 mg/dL    Comment: Performed at [facility], [address] [place], [place] [address]  Urine Drug Screen, Qualitative     Status: Abnormal   Collection Time: 03/23/21 10:04 AM  Result Value Ref Range   Tricyclic, Ur Screen NONE DETECTED NONE DETECTED   Amphetamines, Ur Screen POSITIVE (A) NONE DETECTED   MDMA (Ecstasy)Ur Screen NONE DETECTED NONE DETECTED   Cocaine Metabolite,Ur [place] POSITIVE (A) NONE DETECTED   Opiate, Ur Screen NONE DETECTED NONE DETECTED   Phencyclidine (PCP) Ur S NONE DETECTED NONE DETECTED   Cannabinoid 50 Ng, Ur [place] POSITIVE (A) NONE DETECTED   Barbiturates, Ur Screen NONE DETECTED NONE DETECTED   Benzodiazepine, Ur Scrn NONE DETECTED NONE DETECTED   Methadone Scn, Ur NONE DETECTED NONE DETECTED    Comment: (NOTE) Tricyclics + metabolites, urine    Cutoff 1000 ng/mL Amphetamines + metabolites, urine  Cutoff 1000 ng/mL MDMA (Ecstasy), urine              Cutoff 500 ng/mL Cocaine Metabolite, urine          Cutoff 300 ng/mL Opiate + metabolites, urine        Cutoff 300 ng/mL Phencyclidine (PCP), urine         Cutoff 25 ng/mL Cannabinoid, urine                 Cutoff 50 ng/mL Barbiturates + metabolites, urine  Cutoff 200 ng/mL Benzodiazepine, urine              Cutoff 200 ng/mL Methadone, urine                   Cutoff 300 ng/mL  The urine drug screen provides only a preliminary, unconfirmed analytical test result and should not be used for non-medical purposes. Clinical consideration and professional judgment should be applied to any positive drug screen result due to possible interfering substances. A more specific alternate chemical  method must be used in order to obtain a confirmed analytical result. Gas chromatography / mass spectrometry (GC/MS) is the preferred confirm atory method. Performed at [facility], [address] [place], [place] [address]   Acetaminophen level     Status: None   Collection Time: 03/23/21 12:35 PM  Result Value Ref Range   Acetaminophen (Tylenol), Serum 15 10 - 30 ug/mL    Comment: (NOTE) Therapeutic concentrations vary significantly. A range of 10-30 ug/mL  may be an effective concentration for many patients. However, some  are best treated at concentrations outside of this range. Acetaminophen concentrations >150 ug/mL at 4 hours after ingestion  and >50 ug/mL at 12 hours after ingestion are often associated with  toxic reactions.  Performed at [facility], [address] [place], [place] [address]     Current Facility-Administered Medications  Medication Dose Route Frequency Provider Last Rate Last Admin   ziprasidone (GEODON) injection 20 mg  20 mg Intramuscular Q12H PRN Alyria Krack, Jackquline Denmark, MD       Current Outpatient Medications  Medication Sig Dispense Refill   famotidine (PEPCID) 20 MG tablet Take 1 tablet (20 mg total) by mouth 2 (two) times daily. 40 tablet 0   ibuprofen (ADVIL) 600 MG tablet Take 1 tablet (600 mg total) by mouth every 6 (six) hours as needed. 30 tablet 0    Musculoskeletal: Strength & Muscle Tone: within normal limits Gait & Station: normal Patient leans: N/A  Psychiatric Specialty Exam:  Presentation  General Appearance: Disheveled  Eye Contact:Absent  Speech:Slow  Speech Volume:Decreased  Handedness: No data recorded  Mood and Affect  Mood:Depressed; Anxious  Affect:Blunt   Thought Process  Thought Processes:Coherent  Descriptions of Associations:Intact  Orientation:Full (Time, Place and Person)  Thought Content:WDL  History of Schizophrenia/Schizoaffective disorder:No data  recorded Duration of Psychotic Symptoms:No data recorded Hallucinations:Hallucinations: None  Ideas of Reference:None  Suicidal Thoughts:Suicidal Thoughts: No (Denies currently)  Homicidal Thoughts:Homicidal Thoughts: No   Sensorium  Memory:Immediate Fair  Judgment:Impaired  Insight:Poor   Executive Functions  Concentration:Fair  Attention Span:Fair  Recall:Fair  Fund of Knowledge:Fair  Language:Fair   Psychomotor Activity  Psychomotor Activity:Psychomotor Activity: Normal   Assets  Assets:Resilience; Physical Health; Housing   Sleep  Sleep:Sleep: Poor   Physical Exam: Physical Exam Vitals and nursing note reviewed.  Constitutional:      Appearance: Normal appearance.  HENT:     Head: Normocephalic and atraumatic.     Mouth/Throat:     Pharynx: Oropharynx is clear.  Eyes:     Pupils: Pupils are equal, round, and reactive to light.  Cardiovascular:     Rate and Rhythm: Normal rate and regular rhythm.  Pulmonary:     Effort: Pulmonary effort is normal.     Breath sounds: Normal breath sounds.  Abdominal:     General: Abdomen is flat.     Palpations: Abdomen is soft.  Musculoskeletal:        General: Normal range of motion.  Skin:    General: Skin is warm and dry.  Neurological:     General: No focal deficit present.     Mental Status: He is alert. Mental status is at baseline.  Psychiatric:        Attention and Perception: He is inattentive.        Mood and Affect: Mood normal. Affect is labile and angry.        Speech: Speech is tangential.        Behavior: Behavior is agitated. Behavior is not aggressive.        Thought Content: Thought content includes suicidal ideation. Thought content does not include suicidal plan.        Cognition and Memory: Memory is impaired.        Judgment: Judgment is impulsive.   Review of Systems  Constitutional: Negative.   HENT: Negative.    Eyes: Negative.   Respiratory: Negative.    Cardiovascular:  Negative.   Gastrointestinal: Negative.   Musculoskeletal: Negative.   Skin: Negative.   Neurological: Negative.   Psychiatric/Behavioral:  Positive for depression, memory loss, substance abuse and suicidal ideas. Negative for hallucinations. The patient is nervous/anxious and has insomnia.   Blood pressure 117/68, pulse 80, temperature (!) 97.3 F (36.3 C), temperature source Oral, resp. rate 16, height 5\' 11"  (1.803 m), weight 81.6 kg, SpO2 95 %. Body mass index is 25.1 kg/m.  Treatment Plan Summary: Medication management and Plan patient took an intentional overdose of muscle relaxers and over-the-counter sleeping medicine.  Tired now but awake and lucid enough to give history.  Mood bad.  Out of control behavior poor self-care.  Recommend inpatient psychiatric stabilization once he is medically cleared.  Patient will need to be monitored according to poison control for several more hours before we can pursue that.  Patient was advised of the plan and became acutely agitated and verbal but not personally threatening.  Was given some medication and is now resting again in the  emergency room.  Case reviewed with emergency room physician and TTS.  Disposition: Recommend psychiatric Inpatient admission when medically cleared. Supportive therapy provided about ongoing stressors. Discussed crisis plan, support from social network, calling 911, coming to the Emergency Department, and calling Suicide Hotline.  Mordecai Rasmussen, MD 03/23/2021 1:58 PM

## 2021-03-23 NOTE — ED Notes (Signed)
[facility]  CONSULT  DONE  PENDING  PLACEMENT 

## 2021-03-23 NOTE — ED Notes (Signed)
This RN spoke with Angelique Blonder from [employer]. Denise updated-pt has voided. Will call back with EKG

## 2021-03-23 NOTE — BH Assessment (Signed)
Referral information for Psychiatric Hospitalization faxed to;   [facility] ([phone]-or- [phone]),   [facility] ([phone]---[phone]),  [facility] ([phone], [phone], [phone] or [phone]),   [facility] ([phone]),   [facility] ([phone] -or- [phone]),   [facility] ([phone]).  [facility] ([phone])     

## 2021-03-23 NOTE — ED Notes (Signed)
Pt awake & asking about a visitor. Pt updated.

## 2021-03-23 NOTE — ED Notes (Addendum)
Poison control contacted. Spoke with Raynelle Fanning. Recommendations:   Recommends activated 1g per kg of charcoal without sorbitol preferred if pt is alert and able to tolerate. Look out for CNS depressions, hypotension and bradycardia. 6hr observation. Acetaminophen levels Q4hrs. Benzos PRN for agitation.

## 2021-03-23 NOTE — ED Notes (Signed)
Dr.Clapacs at bedside  

## 2021-03-23 NOTE — ED Notes (Signed)
Pt sleeping, resting comfortably in bed, NAD, chest rise & fall. No needs identified at this time. Bed low & locked; call light within reach. 

## 2021-03-23 NOTE — ED Notes (Signed)
This RN spoke with Poison Social worker, who called to get a status update on pt. She recommends a repeat EKG & will be calling back for results of that as well as to see if pt has voided.

## 2021-03-23 NOTE — ED Notes (Addendum)
Pt dressed out by this RN and [employer]mer, [employer].  1 cross necklace 1 black shirt  1 navy sweatpants 1 pair black socks  1 pair white shoes 1 pair sweatpants 1 pair basketball shorts 1 pair swimming trunks 1 black watch 1 gray hoodie  1 cell phone  1 gold bracelet 1 black hair tie

## 2021-03-23 NOTE — BH Assessment (Signed)
Comprehensive Clinical Assessment (CCA) Note  03/23/2021 Benjamin [name] [id number]  [name], 30 year old male who presents to [facility] ED involuntarily for treatment. Per triage note, Per EMS, ot took 17 pills of 500mg  methocarbamol and drunk 1/2 bottle of Zequil. Pt states they have been drinking alcohol for the past 4 days. Pt states that they have been feeling overwhelmed. Per police, pt states suicidal intent with drunks. Asked pt and denies any harmful intent. Later he did admit to harmful intent due to losing his family.   During TTS assessment pt presents alert and oriented x 4, anxious but cooperative, and mood-congruent with affect. The pt does not appear to be responding to internal or external stimuli. Neither is the pt presenting with any delusional thinking. Pt verified the information provided to triage RN.   Pt identifies his main complaint to be that he was depressed because his girlfriend packed her bags and left with their 3 kids on 03/15/21. Patient reports his drinking was an issue. Patient states since that time, he has not gone to work, is not sleeping or eating and his drinking has increased. Patient reports he found a bottle of pills on the ground and decided to keep them. Patient reports he looked up the prescription and found out they were muscle relaxers. Patient admits it was not the best decision but he misses his family and wants to work things out with his girlfriend. Pt admits that he also smokes marijuana daily and occasionally uses "uppers". Patient reports he drinks 5-6 regular size beers daily. Pt reports INPT hx years ago as teenager for anger issues and ADHD but he is not currently taking any medications at this time. Pt denies current SI/HI/AH/VH. " I had a thought and it was dumb of me to do that."    Per Dr. 03/17/21 pt is recommended for inpatient psychiatric admission.    Chief Complaint:  Chief Complaint  Patient presents with   Drug Overdose    Visit Diagnosis: Severe major depression, single episode, without psychotic features    CCA Screening, Triage and Referral (STR)  Patient Reported Information How did you hear about Toni Amend? -- (EMS)  Referral name: No data recorded Referral phone number: No data recorded  Whom do you see for routine medical problems? No data recorded Practice/Facility Name: No data recorded Practice/Facility Phone Number: No data recorded Name of Contact: No data recorded Contact Number: No data recorded Contact Fax Number: No data recorded Prescriber Name: No data recorded Prescriber Address (if known): No data recorded  What Is the Reason for Your Visit/Call Today? Patient took several pills because he was depressed after his girlfriend left him because of his drinking problem.  How Long Has This Been Causing You Problems? 1 wk - 1 month  What Do You Feel Would Help You the Most Today? -- (Assessment only)   Have You Recently Been in Any Inpatient Treatment (Hospital/Detox/Crisis Center/28-Day Program)? No data recorded Name/Location of Program/Hospital:No data recorded How Long Were You There? No data recorded When Were You Discharged? No data recorded  Have You Ever Received Services From [facility] Before? No data recorded Who Do You See at [facility]? No data recorded  Have You Recently Had Any Thoughts About Hurting Yourself? Yes  Are You Planning to Commit Suicide/Harm Yourself At This time? No   Have you Recently Had Thoughts About Hurting Someone [facility]? No  Explanation: No data recorded  Have You Used Any Alcohol or Drugs in  the Past 24 Hours? Yes  How Long Ago Did You Use Drugs or Alcohol? No data recorded What Did You Use and How Much? Alcohol- unknown   Do You Currently Have a Therapist/Psychiatrist? No  Name of Therapist/Psychiatrist: No data recorded  Have You Been Recently Discharged From Any Office Practice or Programs? No  Explanation of Discharge From  Practice/Program: No data recorded    CCA Screening Triage Referral Assessment Type of Contact: Face-to-Face  Is this Initial or Reassessment? No data recorded Date Telepsych consult ordered in [facility]:  No data recorded Time Telepsych consult ordered in [facility]:  No data recorded  Patient Reported Information Reviewed? No data recorded Patient Left Without Being Seen? No data recorded Reason for Not Completing Assessment: No data recorded  Collateral Involvement: [name]- girlfriend   Does Patient Have a Automotive engineer Guardian? No data recorded Name and Contact of Legal Guardian: No data recorded If Minor and Not Living with Parent(s), Who has Custody? n/a  Is CPS involved or ever been involved? Never  Is APS involved or ever been involved? Never   Patient Determined To Be At Risk for Harm To Self or Others Based on Review of Patient Reported Information or Presenting Complaint? No  Method: No data recorded Availability of Means: No data recorded Intent: No data recorded Notification Required: No data recorded Additional Information for Danger to Others Potential: No data recorded Additional Comments for Danger to Others Potential: No data recorded Are There Guns or Other Weapons in Your Home? No data recorded Types of Guns/Weapons: No data recorded Are These Weapons Safely Secured?                            No data recorded Who Could Verify You Are Able To Have These Secured: No data recorded Do You Have any Outstanding Charges, Pending Court Dates, Parole/Probation? No data recorded Contacted To Inform of Risk of Harm To Self or Others: No data recorded  Location of Assessment: [facility] ED   Does Patient Present under Involuntary Commitment? Yes  IVC Papers Initial File Date: 03/23/21   [place] of Residence: [place]   Patient Currently Receiving the Following Services: Not Receiving Services   Determination of Need: Urgent (48 hours)   Options For Referral:  Inpatient Hospitalization; ED Visit; Medication Management     CCA Biopsychosocial Intake/Chief Complaint:  No data recorded Current Symptoms/Problems: No data recorded  Patient Reported Schizophrenia/Schizoaffective Diagnosis in Past: No   Strengths: Communicate and verbalize needs  Preferences: No data recorded Abilities: No data recorded  Type of Services Patient Feels are Needed: No data recorded  Initial Clinical Notes/Concerns: No data recorded  Mental Health Symptoms Depression:   Change in energy/activity; Difficulty Concentrating; Sleep (too much or little); Increase/decrease in appetite; Hopelessness   Duration of Depressive symptoms:  Greater than two weeks   Mania:   None   Anxiety:    Worrying; Restlessness; Difficulty concentrating   Psychosis:   None   Duration of Psychotic symptoms: No data recorded  Trauma:   None   Obsessions:   None   Compulsions:   Disrupts with routine/functioning   Inattention:   None   Hyperactivity/Impulsivity:   None   Oppositional/Defiant Behaviors:   None   Emotional Irregularity:   Chronic feelings of emptiness; Potentially harmful impulsivity   Other Mood/Personality Symptoms:  No data recorded   Mental Status Exam Appearance and self-care  Stature:   Average  Weight:   Average weight   Clothing:   Disheveled   Grooming:   Neglected   Cosmetic use:   None   Posture/gait:   Slumped   Motor activity:   Slowed   Sensorium  Attention:   Normal   Concentration:   Normal   Orientation:   X5   Recall/memory:   Normal   Affect and Mood  Affect:   Depressed; Anxious   Mood:   Depressed; Anxious; Hopeless   Relating  Eye contact:   Fleeting   Facial expression:   Depressed   Attitude toward examiner:   Cooperative   Thought and Language  Speech flow:  Slurred   Thought content:   Appropriate to Mood and Circumstances   Preoccupation:   None   Hallucinations:    None   Organization:  No data recorded  [employer] of Knowledge:   Average   Intelligence:   Average   Abstraction:  No data recorded  Judgement:   Impaired   Reality Testing:   Adequate   Insight:   Poor   Decision Making:   Impulsive   Social Functioning  Social Maturity:   Impulsive; Irresponsible   Social Judgement:   Victimized   Stress  Stressors:   Family conflict; Relationship; Grief/losses   Coping Ability:   Human resources officer Deficits:   Decision making   Supports:   Family     Religion:    Leisure/Recreation:    Exercise/Diet: Exercise/Diet Do You Have Any Trouble Sleeping?: Yes Explanation of Sleeping Difficulties: Patient reports he is not sleeping well.   CCA Employment/Education Employment/Work Situation: Employment / Work Situation Employment Situation: Employed Patient's Job has Been Impacted by Current Illness: Yes Describe how Patient's Job has Been Impacted: Patient reports he hasn't worked since 03/15/21.  Education:     CCA Family/Childhood History Family and Relationship History: Family history Marital status: Single Does patient have children?: Yes How many children?: 3 How is patient's relationship with their children?: Patient reports he has a great relationship with his kids.  Childhood History:     Child/Adolescent Assessment:     CCA Substance Use Alcohol/Drug Use: Alcohol / Drug Use Pain Medications: See PTA Prescriptions: See PTA Over the Counter: See PTA History of alcohol / drug use?: Yes Longest period of sobriety (when/how long): Unknown Negative Consequences of Use: Personal relationships Substance #1 Name of Substance 1: Alcohol 1 - Frequency: daily 1 - Last Use / Amount: 03/22/21 1- Route of Use: oral                       ASAM's:  Six Dimensions of Multidimensional Assessment  Dimension 1:  Acute Intoxication and/or Withdrawal Potential:       Dimension 2:  Biomedical Conditions and Complications:      Dimension 3:  Emotional, Behavioral, or Cognitive Conditions and Complications:     Dimension 4:  Readiness to Change:     Dimension 5:  Relapse, Continued use, or Continued Problem Potential:     Dimension 6:  Recovery/Living Environment:     ASAM Severity Score:    ASAM Recommended Level of Treatment:     Substance use Disorder (SUD)    Recommendations for Services/Supports/Treatments: Recommendations for Services/Supports/Treatments Recommendations For Services/Supports/Treatments: Inpatient Hospitalization  DSM5 Diagnoses: Patient Active Problem List   Diagnosis Date Noted   Intentional overdose (HCC) 03/23/2021   Severe major depression, single episode, without psychotic features (HCC) 03/23/2021  Alcohol abuse 03/23/2021   Cocaine abuse (HCC) 03/23/2021    Patient Centered Plan: Patient is on the following Treatment Plan(s):  Depression   Referrals to Alternative Service(s): Referred to Alternative Service(s):   Place:   Date:   Time:    Referred to Alternative Service(s):   Place:   Date:   Time:    Referred to Alternative Service(s):   Place:   Date:   Time:    Referred to Alternative Service(s):   Place:   Date:   Time:     Gianni Fuchs Dierdre Searles, Counselor, LCAS-A

## 2021-03-23 NOTE — ED Notes (Signed)
Pt given meal tray.

## 2021-03-23 NOTE — ED Triage Notes (Signed)
Per EMS, ot took 17 pills of 500mg  methocarbamol and drunk 1/2 bottle of Zequil. Pt states they have been drinking alcohol for the past 4 days. Pt states that they have been feeling overwhelmed. Per police, pt states suicidal intent with drunks. Asked pt and denies any harmful intent.   Later he did admit to harmful intent due to losing his family.

## 2021-03-24 LAB — HEMOGLOBIN A1C
Hgb A1c MFr Bld: 5.2 % (ref 4.8–5.6)
Mean Plasma Glucose: 103 mg/dL

## 2021-03-24 NOTE — ED Notes (Signed)
Provided phone to pt.

## 2021-03-24 NOTE — ED Notes (Signed)
Tried to call report to [facility] [place] at [phone]. RN still doing med pass, was told to call back after 0830.

## 2021-03-24 NOTE — ED Notes (Signed)
Tried to call Benjamin [name] again at different number that was provided by counselor: [phone]. Transferred to [place] by person who picked up phone. No answer at [facility] once call was transferred. Will continue trying to call report.

## 2021-03-24 NOTE — ED Provider Notes (Signed)
Emergency Medicine Observation Re-evaluation Note  Benjamin [name] is a 30 y.o. male, seen on rounds today.  Pt initially presented to the ED for complaints of Drug Overdose Currently, the patient is resting, voices no medical complaints.  Physical Exam  BP 116/70   Pulse 66   Temp (!) 97.3 F (36.3 C) (Oral)   Resp 15   Ht 5\' 11"  (1.803 m)   Wt 81.6 kg   SpO2 98%   BMI 25.10 kg/m  Physical Exam General: Resting in no acute distress Cardiac: No cyanosis Lungs: Equal rise and fall Psych: Not agitated  ED Course / MDM  EKG:EKG Interpretation  Date/Time:  Monday March 23 2021 10:01:17 EDT Ventricular Rate:  79 PR Interval:  178 QRS Duration: 97 QT Interval:  396 QTC Calculation: 454 R Axis:   70 Text Interpretation: Sinus rhythm Probable left atrial enlargement Anteroseptal infarct, old Baseline wander in lead(s) V2 Confirmed by UNCONFIRMED, DOCTOR (11-12-1974), editor [id number], Tammy [phone]) on 03/23/2021 11:38:48 AM  I have reviewed the labs performed to date as well as medications administered while in observation.  Recent changes in the last 24 hours include no events overnight.  Plan  Current plan is for psychiatric disposition. Benjamin [name] is under involuntary commitment.      Jaci Lazier, MD 03/24/21 [phone]

## 2021-03-24 NOTE — ED Notes (Signed)
Secretary is putting in for transport to [facility]. Tried to call again at [phone] to give report. No answer.

## 2021-03-24 NOTE — ED Notes (Signed)
IVC/Pending placement 

## 2021-03-24 NOTE — ED Notes (Signed)
[facility] documentation reviewed by this RN and all up to date.

## 2021-03-24 NOTE — ED Notes (Signed)
Provided breakfast tray. Finger foods. Pt awake. Informed plan to transfer to [facility].

## 2021-03-24 NOTE — ED Notes (Signed)
Tried to call [name] to give report. No answer times 2. Will call back.

## 2021-03-24 NOTE — ED Notes (Signed)
Ice water given to pt °

## 2021-03-24 NOTE — ED Notes (Signed)
Pt leaving with sheriff dept. Pt calm and cooperative at this time. Pt belongings bag nhanded to sheriff in front of pt.

## 2021-03-24 NOTE — ED Notes (Signed)
Spoke with poison control nurse. Informed of current status, including mental status and VS. They are closing case on their end.

## 2023-01-09 IMAGING — CR DG CHEST 2V
3 series · 3 of 3 positions shown · non-contrast
Comparison: None.

CLINICAL DATA: Left-sided chest pain

EXAM:
CHEST - 2 VIEW

[chest pa]
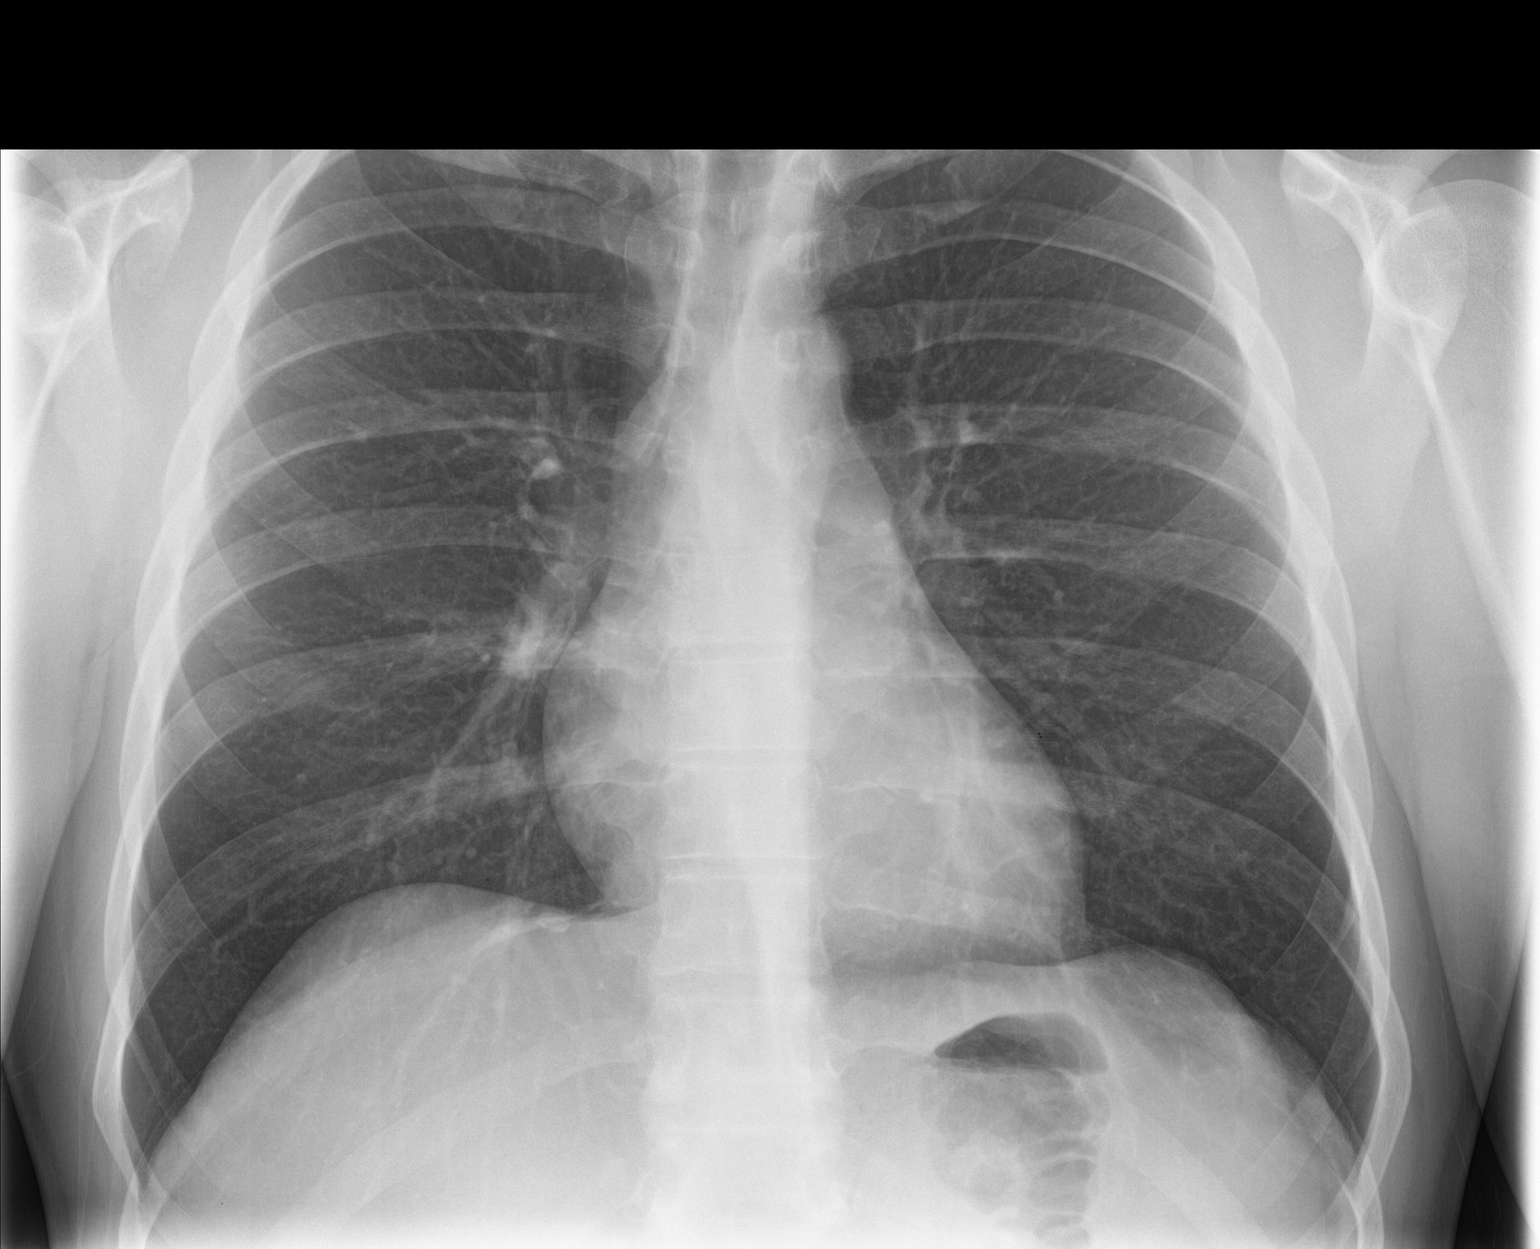

[chest lat (1 of 2)]
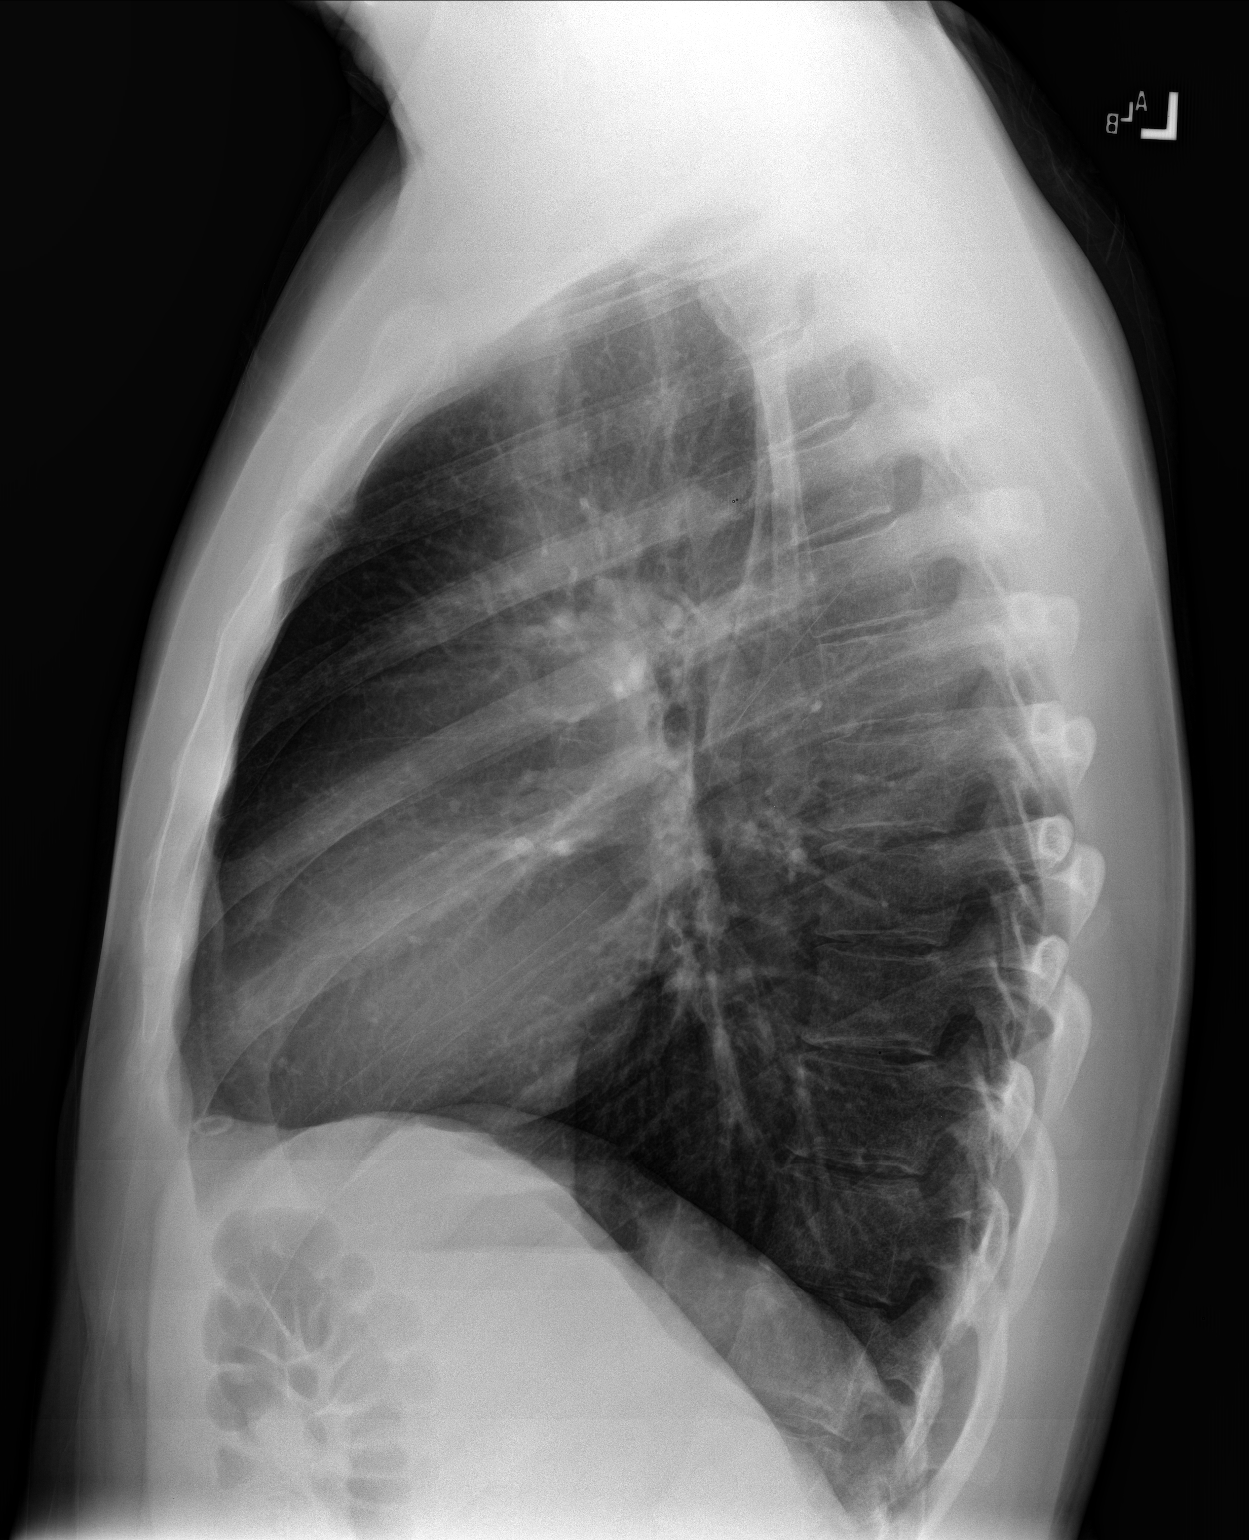

[chest lat (2 of 2)]
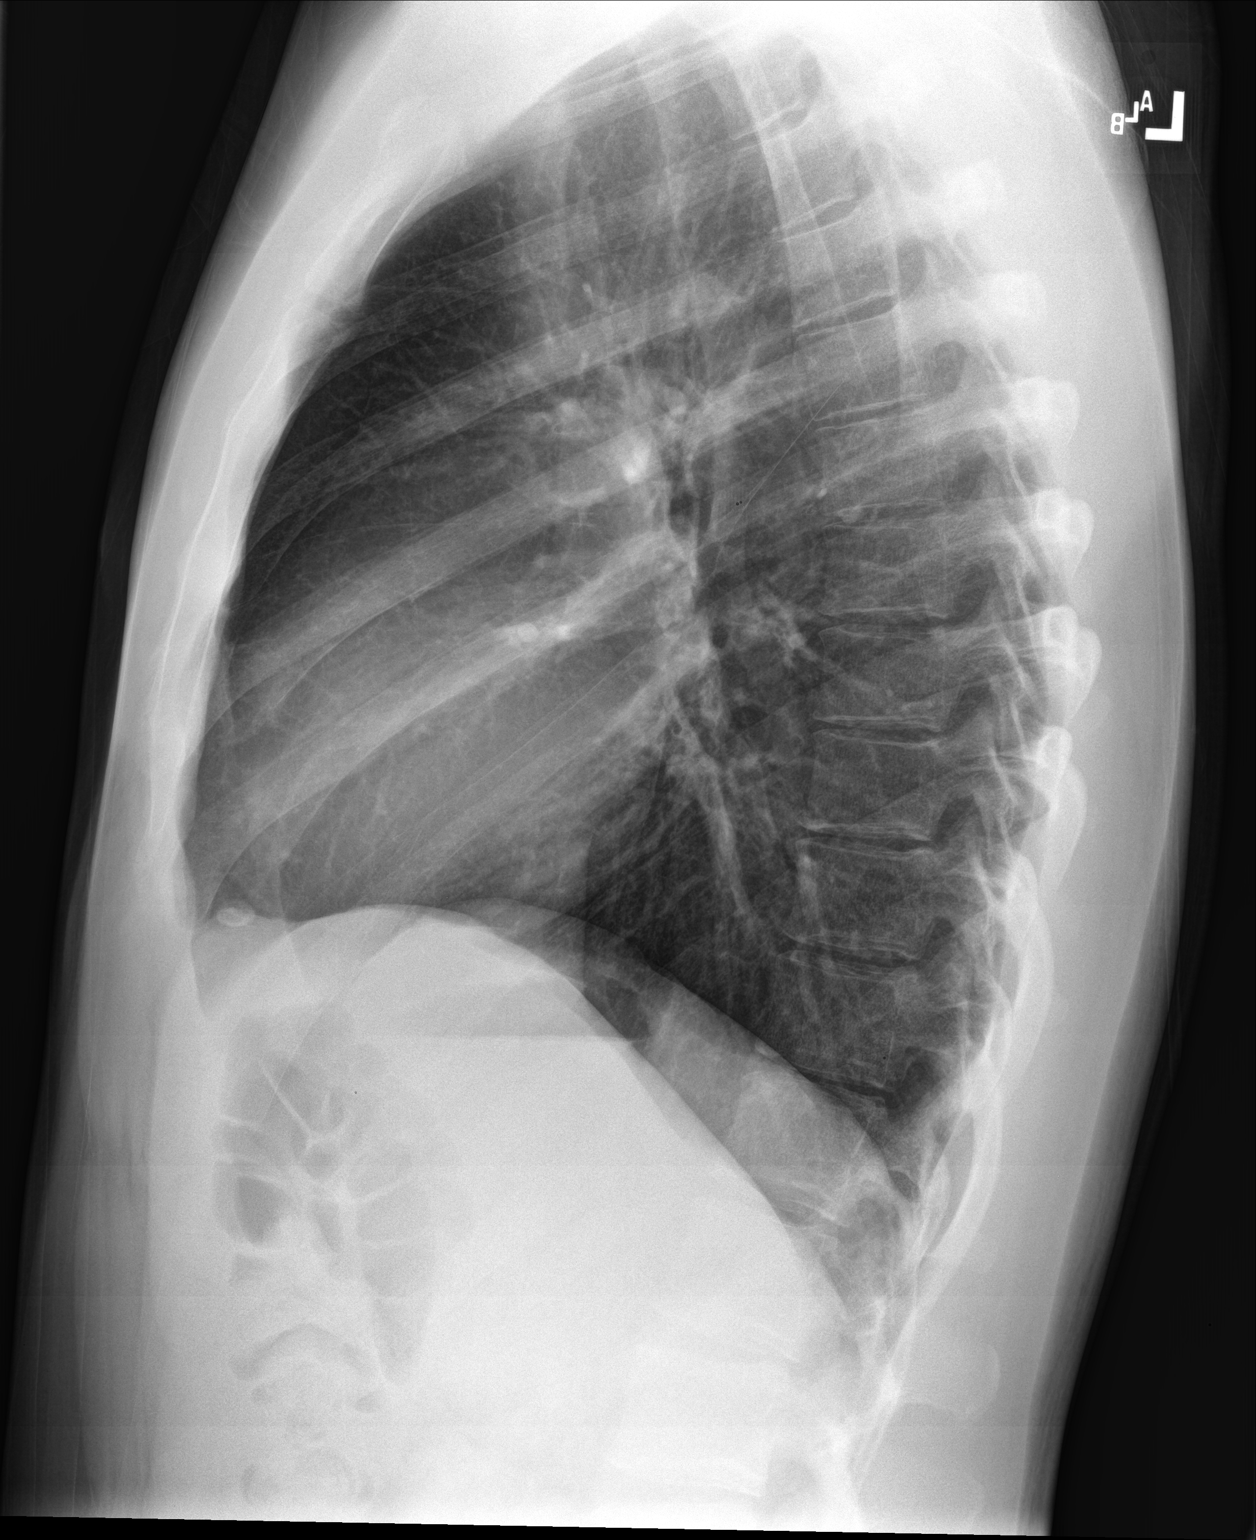

[3 of 3 positions shown; findings below may reference images not displayed]

FINDINGS: The heart size and mediastinal contours are within normal limits.
Both lungs are clear. The visualized skeletal structures are
unremarkable.
IMPRESSION: No active cardiopulmonary disease.
